# Patient Record
Sex: Male | Born: 1960 | Race: White | Hispanic: No | Marital: Single
Health system: Southern US, Community
[De-identification: ages and names within clinical notes are randomized; demographics above are authoritative.]

---

## 2009-08-29 ENCOUNTER — Inpatient Hospital Stay: Payer: Self-pay | Admitting: Internal Medicine

## 2011-04-19 IMAGING — US US PELVIS LIMITED
1 series · 17 of 25 positions shown · non-contrast
Comparison: none

REASON FOR EXAM: swollen testicle
COMMENTS:   LMP: (Male)

[Series 1: us pelvis limited · 17 of 48 slices shown]
[im 1/48]
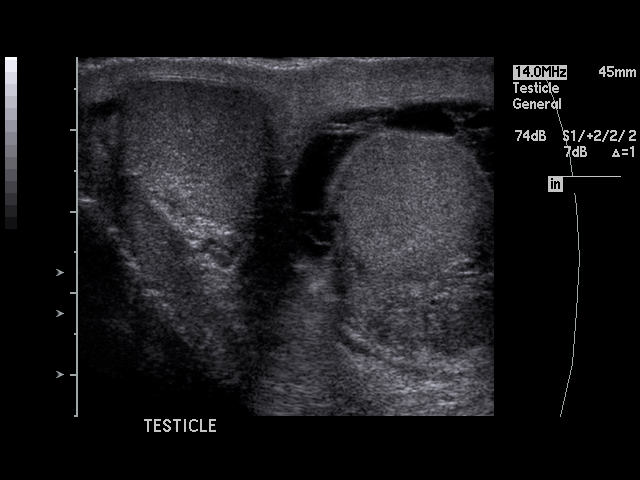
[im 4/48]
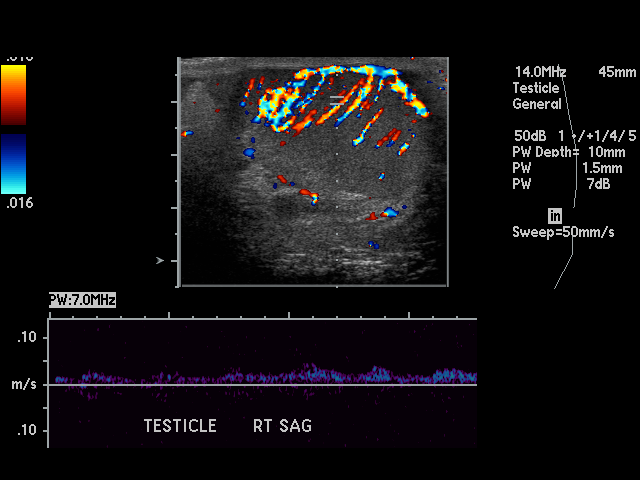
[im 6/48]
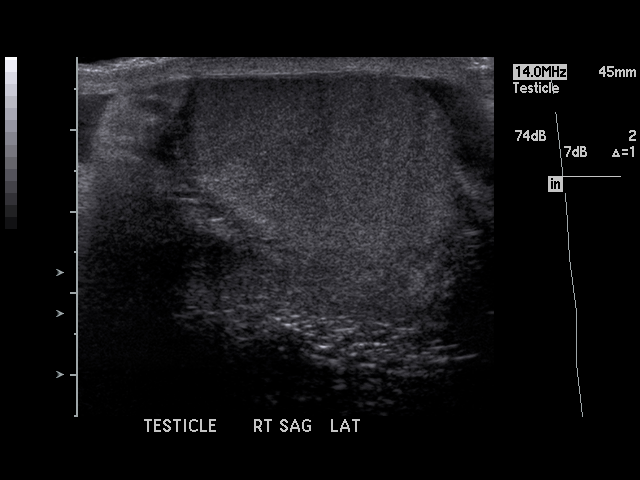
[im 10/48]
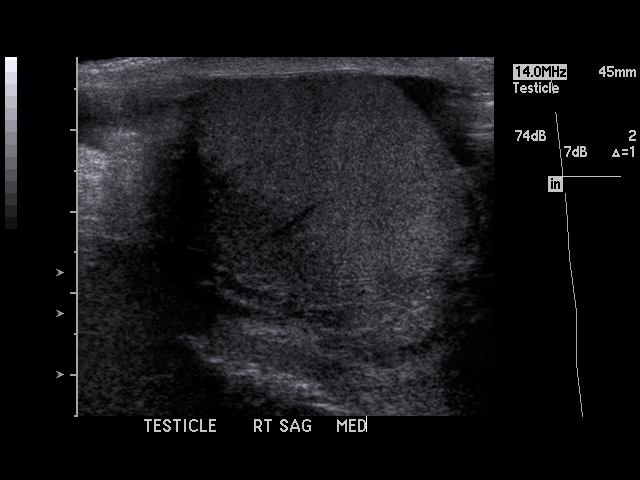
[im 12/48]
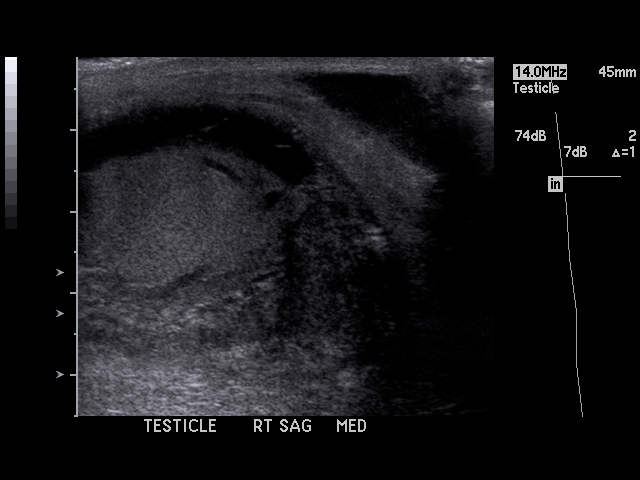
[im 16/48]
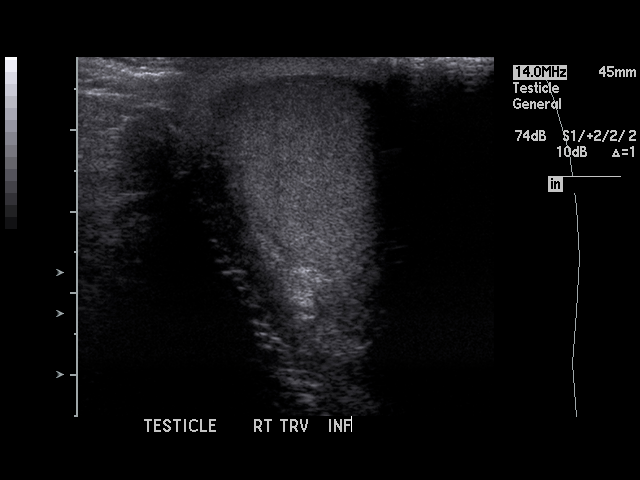
[im 18/48]
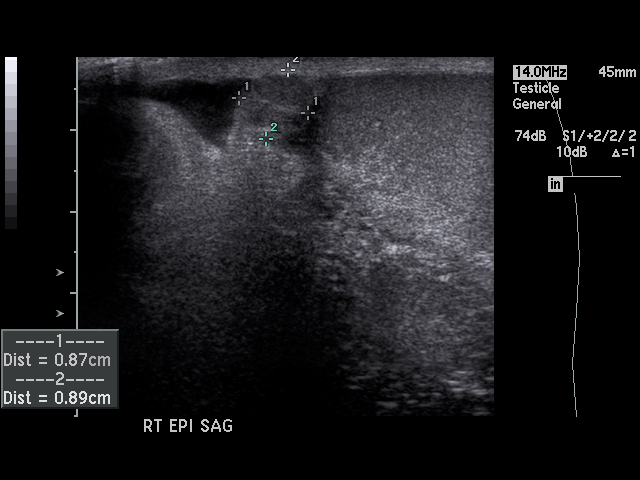
[im 22/48]
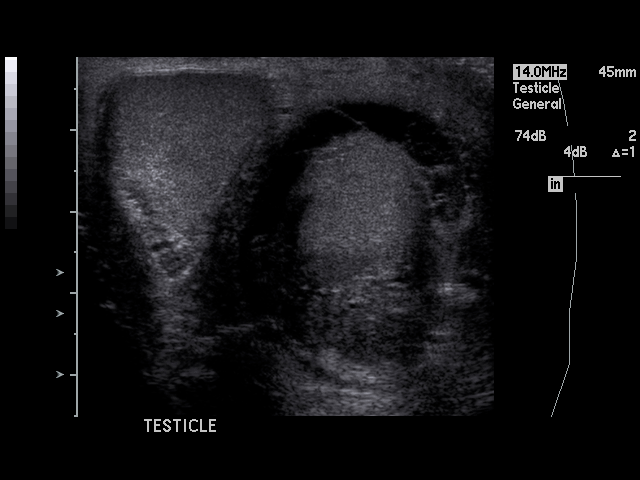
[im 24/48]
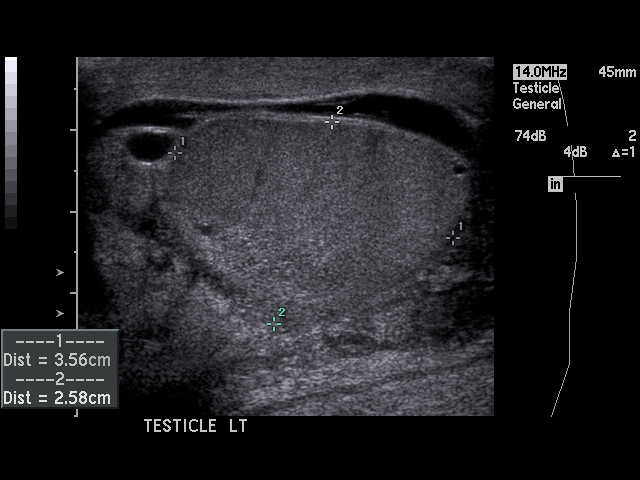
[im 26/48]
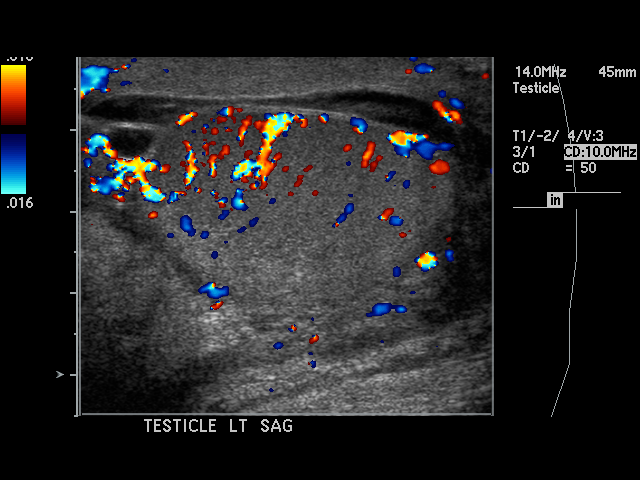
[im 30/48]
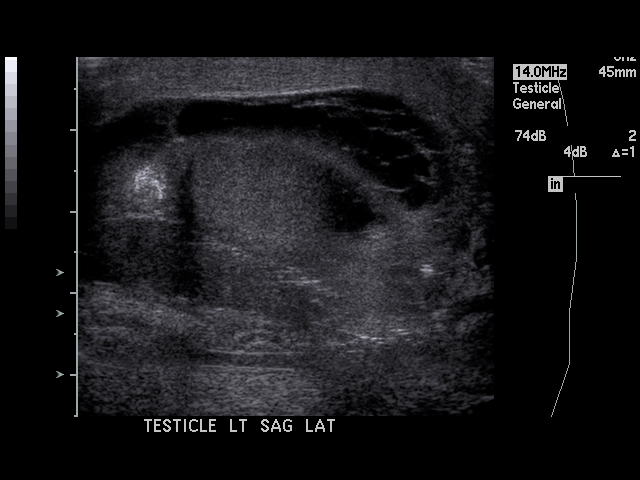
[im 32/48]
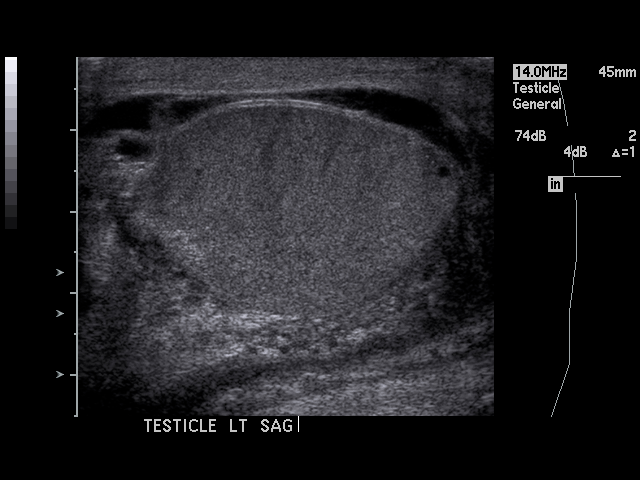
[im 36/48]
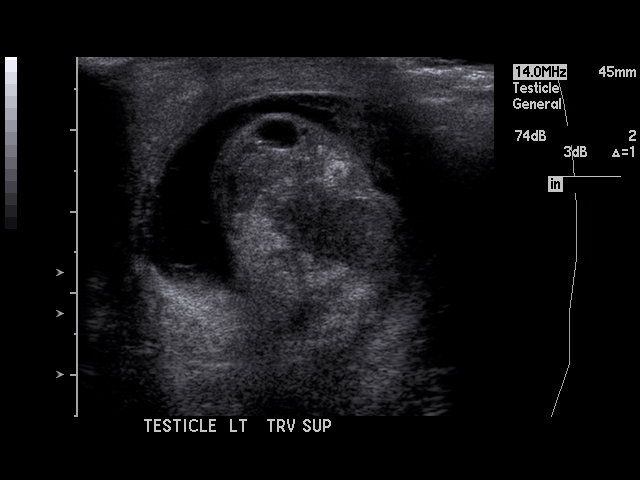
[im 38/48]
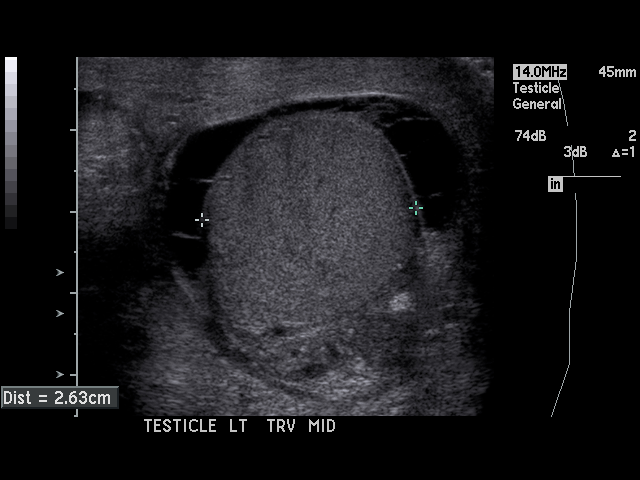
[im 42/48]
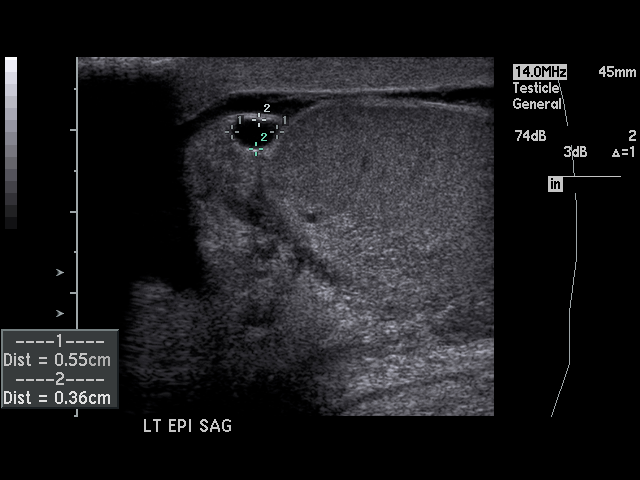
[im 44/48]
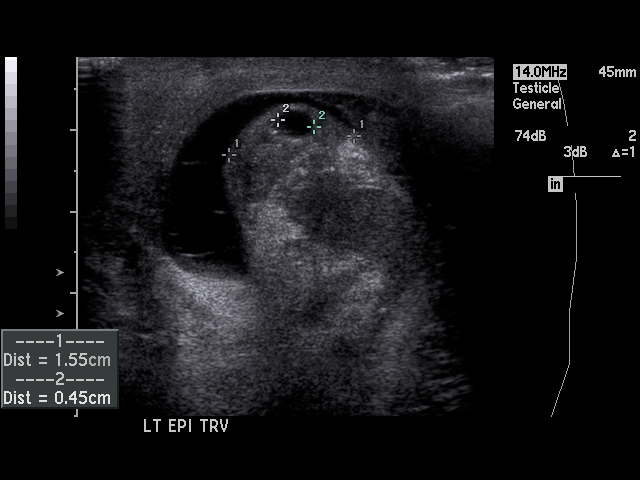
[im 48/48]
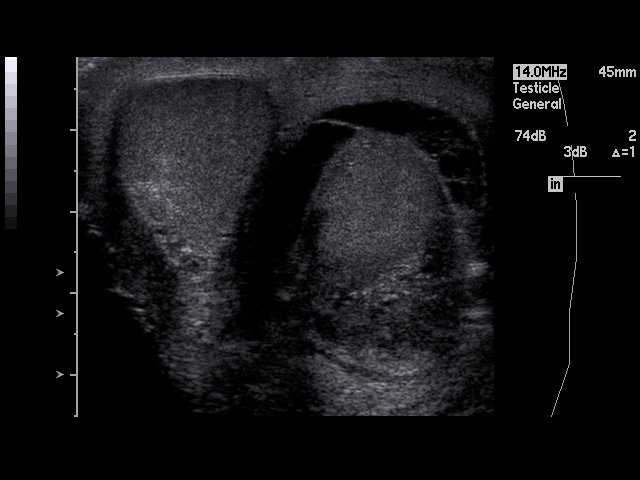

[17 of 25 positions shown; findings below may reference images not displayed]

PROCEDURE:     US  - US TESTICULAR  - August 28, 2009  [DATE]

RESULT:     The right and left testicles the testes exhibit normal
echotexture. Their vascularity is normal bilaterally. The right testicle
measures 3.9 x 2.3 x 2.6 cm. The left testicle measures 3.6 x 2.6 x 2.6 cm.

There is a hydrocele on the left containing septations. The epididymal
structures appear normal with there being an epididymal cyst on the left
measuring approximately 5 mm in diameter.
IMPRESSION: 1. There is no evidence of testicular torsion.
2. There is a hydrocele on the left containing septations.
3. There is an epididymal cyst on the left. Vascularity of the epididymal
structures is normal.

## 2011-04-22 IMAGING — CR DG CHEST 1V PORT
1 series · 1 of 1 positions shown · non-contrast
Comparison: none

REASON FOR EXAM: sepsis
COMMENTS:

PROCEDURE:     DXR - DXR PORTABLE CHEST SINGLE VIEW  - August 31, 2009  [DATE]
RESULT:     The patient has taken a poor inspiratory effort. No acute
cardiopulmonary disease noted.

[view not recorded]
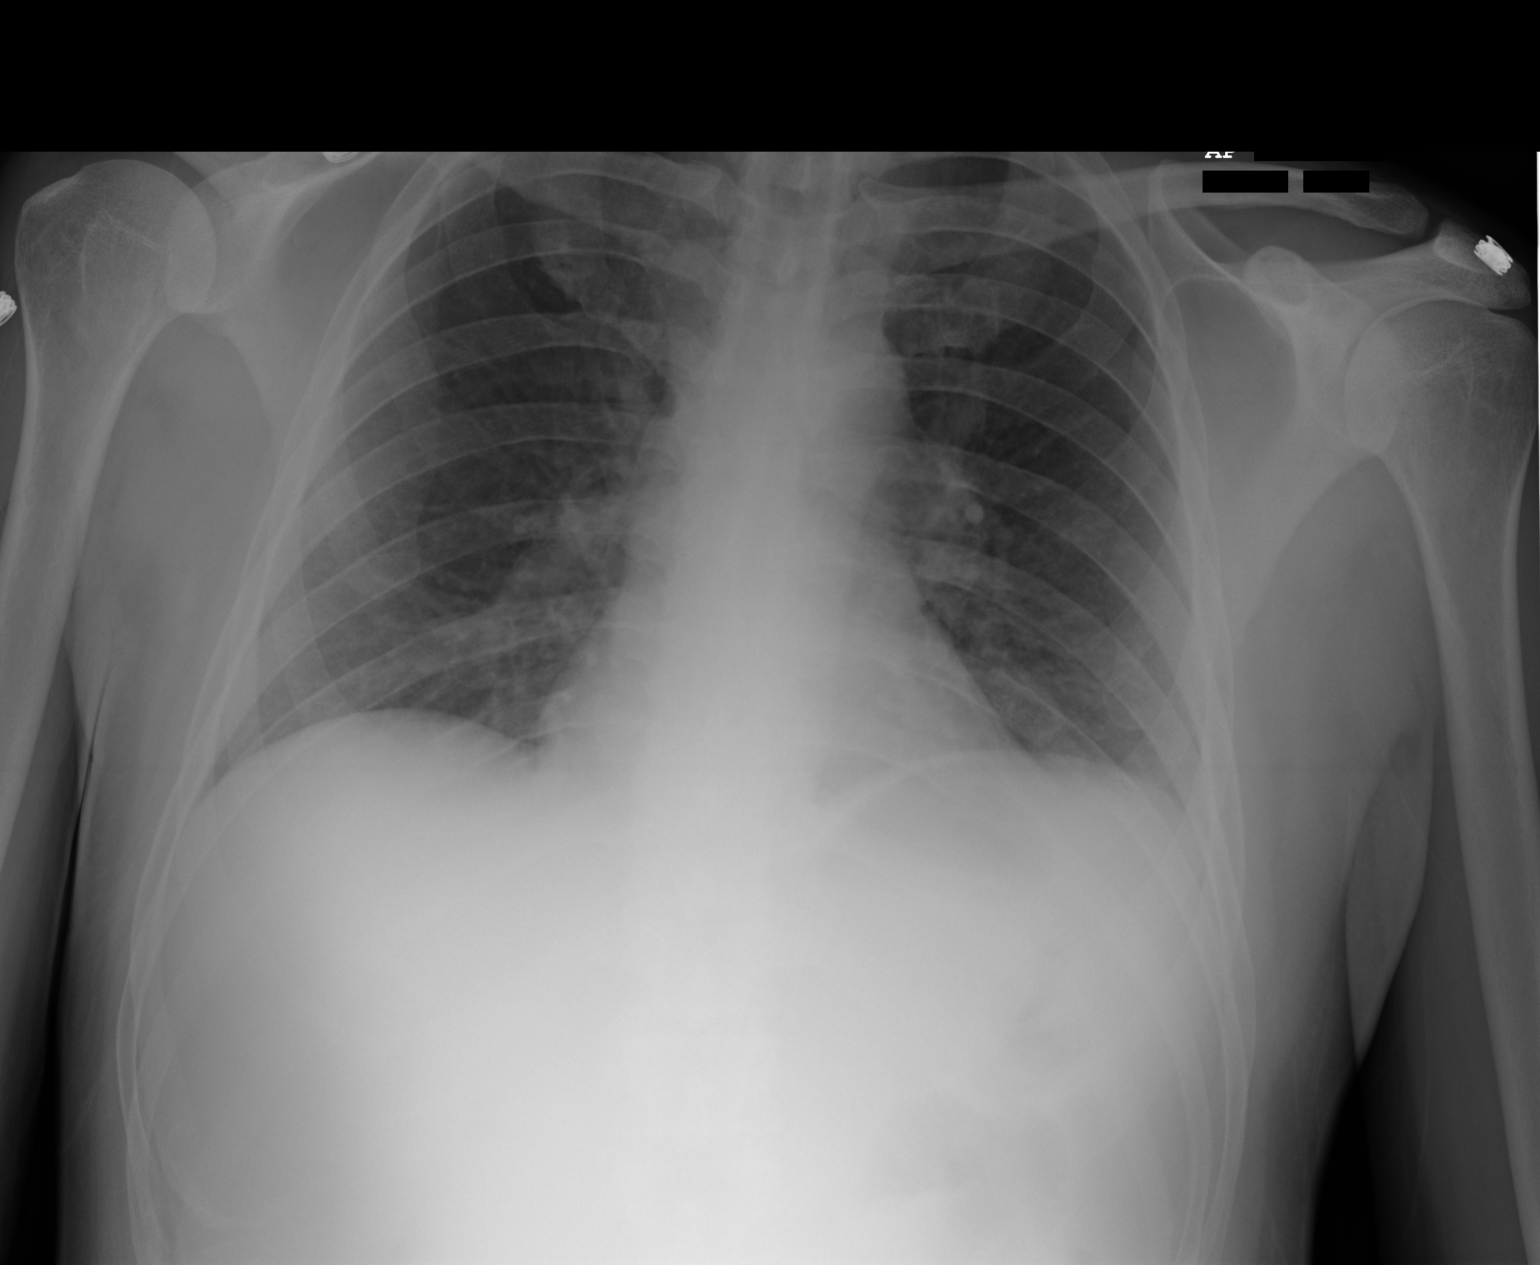

[1 of 1 positions shown; findings below may reference images not displayed]

IMPRESSION: 1. Mild bibasilar atelectasis most likely from poor inspiratory effort. No
acute abnormality identified. Chest is stable from prior exam.

## 2013-08-25 DIAGNOSIS — J45909 Unspecified asthma, uncomplicated: Secondary | ICD-10-CM | POA: Insufficient documentation

## 2013-08-25 DIAGNOSIS — F09 Unspecified mental disorder due to known physiological condition: Secondary | ICD-10-CM | POA: Insufficient documentation

## 2013-08-25 DIAGNOSIS — R251 Tremor, unspecified: Secondary | ICD-10-CM | POA: Insufficient documentation

## 2013-08-25 DIAGNOSIS — M24532 Contracture, left wrist: Secondary | ICD-10-CM | POA: Insufficient documentation

## 2013-08-25 DIAGNOSIS — E785 Hyperlipidemia, unspecified: Secondary | ICD-10-CM | POA: Insufficient documentation

## 2014-05-10 ENCOUNTER — Emergency Department: Payer: Self-pay | Admitting: Emergency Medicine

## 2015-05-30 DIAGNOSIS — K219 Gastro-esophageal reflux disease without esophagitis: Secondary | ICD-10-CM | POA: Insufficient documentation

## 2016-09-14 DIAGNOSIS — Z0000 Encounter for general adult medical examination without abnormal findings: Secondary | ICD-10-CM | POA: Insufficient documentation

## 2016-09-14 DIAGNOSIS — R7303 Prediabetes: Secondary | ICD-10-CM | POA: Insufficient documentation

## 2016-11-24 DIAGNOSIS — I10 Essential (primary) hypertension: Secondary | ICD-10-CM | POA: Insufficient documentation

## 2019-09-26 DIAGNOSIS — Z789 Other specified health status: Secondary | ICD-10-CM | POA: Insufficient documentation

## 2021-05-21 ENCOUNTER — Other Ambulatory Visit: Payer: Self-pay

## 2021-05-21 ENCOUNTER — Ambulatory Visit (INDEPENDENT_AMBULATORY_CARE_PROVIDER_SITE_OTHER): Payer: Medicare Other | Admitting: Podiatry

## 2021-05-21 DIAGNOSIS — L03031 Cellulitis of right toe: Secondary | ICD-10-CM | POA: Diagnosis not present

## 2021-05-21 DIAGNOSIS — L600 Ingrowing nail: Secondary | ICD-10-CM | POA: Diagnosis not present

## 2021-05-21 MED ORDER — DOXYCYCLINE HYCLATE 100 MG PO TABS: 100.0000 mg | ORAL_TABLET | Freq: Two times a day (BID) | ORAL | 0 refills | Status: AC

## 2021-05-21 NOTE — Progress Notes (Signed)
?Subjective:  ?Patient ID: Bradley Cervantes, male    DOB: Sep 09, 1960,  MRN: [id number] ? ?Chief Complaint  ?Patient presents with  ? Nail Problem  ?  Right hallux nail   ? ? ?61 y.o. male presents with the above complaint.  Patient presents with right hallux paronychia.  Patient is is here with his caretaker from nursing facility.  He is unable to compensate however they state that his skin became red out of nowhere. ? ?Versus causing him some discomfort.  There is complaint of bleeding associated with it as well.  He began keeping it nice and dry and keeping it covered.  He has started taking antibiotics from his primary care doctor however was able to review it.  He denies any other acute complaints. ? ? ?Review of Systems: Negative except as noted in the HPI. Denies N/V/F/Ch. ? ?No past medical history on file. ? ?Current Outpatient Medications:  ?  doxycycline (VIBRA-TABS) 100 MG tablet, Take 1 tablet (100 mg total) by mouth 2 (two) times daily for 14 days., Disp: 28 tablet, Rfl: 0 ?  amLODipine (NORVASC) 5 MG tablet, Take 5 mg by mouth daily., Disp: , Rfl:  ?  baclofen (LIORESAL) 10 MG tablet, Take 10 mg by mouth 3 (three) times daily., Disp: , Rfl:  ?  diclofenac Sodium (VOLTAREN) 1 % GEL, Apply topically., Disp: , Rfl:  ?  gabapentin (NEURONTIN) 300 MG capsule, Take by mouth., Disp: , Rfl:  ?  gabapentin (NEURONTIN) 400 MG capsule, Take 400 mg by mouth 3 (three) times daily., Disp: , Rfl:  ?  ketoconazole (NIZORAL) 2 % shampoo, Apply topically., Disp: , Rfl:  ?  levocetirizine (XYZAL) 5 MG tablet, Take 5 mg by mouth daily., Disp: , Rfl:  ?  meloxicam (MOBIC) 7.5 MG tablet, Take 7.5 mg by mouth daily., Disp: , Rfl:  ?  montelukast (SINGULAIR) 10 MG tablet, Take 10 mg by mouth daily., Disp: , Rfl:  ?  pantoprazole (PROTONIX) 40 MG tablet, Take 40 mg by mouth daily., Disp: , Rfl:  ?  propranolol (INDERAL) 40 MG tablet, Take 40 mg by mouth 2 (two) times daily., Disp: , Rfl:  ?  selenium sulfide (SELSUN) 2.5 % shampoo,  Apply topically., Disp: , Rfl:  ? ?Social History  ? ?Tobacco Use  ?Smoking Status Not on file  ?Smokeless Tobacco Not on file  ? ? ?Not on File ?Objective:  ?There were no vitals filed for this visit. ?There is no height or weight on file to calculate BMI. ?Constitutional Well developed. ?Well nourished.  ?Vascular Dorsalis pedis pulses palpable bilaterally. ?Posterior tibial pulses palpable bilaterally. ?Capillary refill normal to all digits.  ?No cyanosis or clubbing noted. ?Pedal hair growth normal.  ?Neurologic Normal speech. ?Oriented to person, place, and time. ?Epicritic sensation to light touch grossly present bilaterally.  ?Dermatologic Paronychia noted to medial lateral proximal nail fold of right hallux.  Mild pain on palpation.  Ingrown nail is noted to both medial lateral border  ?Orthopedic: Normal joint ROM without pain or crepitus bilaterally. ?No visible deformities. ?No bony tenderness.  ? ?Radiographs: None ?Assessment:  ? ?1. Paronychia of toe of right foot due to ingrown toenail   ? ?Plan:  ?Patient was evaluated and treated and all questions answered. ? ?Right hallux paronychia due to ingrown nail ?-I explained to the patient etiology of ingrown advised treatment options were discussed. ?-I believe patient would benefit from antibiotics to help clear out the infection.  Eventually there are some signs of  trauma to the nail and the nail esophagus itself.  I discussed with the patient and the caretaker that if it does not, please Oka back and see me sooner.  I will see him back in 3 weeks to evaluate the redness and make sure it is improving.  Patient was emergency room with the redness becomes more symptomatic. ? ?No follow-ups on file. ?

## 2021-06-11 ENCOUNTER — Ambulatory Visit (INDEPENDENT_AMBULATORY_CARE_PROVIDER_SITE_OTHER): Payer: Medicare Other | Admitting: Podiatry

## 2021-06-11 ENCOUNTER — Other Ambulatory Visit: Payer: Self-pay

## 2021-06-11 DIAGNOSIS — L03031 Cellulitis of right toe: Secondary | ICD-10-CM | POA: Diagnosis not present

## 2021-06-11 DIAGNOSIS — L600 Ingrowing nail: Secondary | ICD-10-CM

## 2021-06-11 NOTE — Progress Notes (Signed)
?  Subjective:  ?Patient ID: Bradley Cervantes, male    DOB: 06/14/1960,  MRN: [id number] ? ?Chief Complaint  ?Patient presents with  ? Nail Problem  ?  Nail check   ? ? ?61 y.o. male presents with the above complaint.  Patient presents with follow-up of right hallux paronychia.  He is states that he is doing a lot better.  He is here with his caretaker.  They both agree with the plan the redness is resolved he is complete his antibiotic course. ? ?Review of Systems: Negative except as noted in the HPI. Denies N/V/F/Ch. ? ?No past medical history on file. ? ?Current Outpatient Medications:  ?  amLODipine (NORVASC) 5 MG tablet, Take 5 mg by mouth daily., Disp: , Rfl:  ?  baclofen (LIORESAL) 10 MG tablet, Take 10 mg by mouth 3 (three) times daily., Disp: , Rfl:  ?  diclofenac Sodium (VOLTAREN) 1 % GEL, Apply topically., Disp: , Rfl:  ?  gabapentin (NEURONTIN) 300 MG capsule, Take by mouth., Disp: , Rfl:  ?  gabapentin (NEURONTIN) 400 MG capsule, Take 400 mg by mouth 3 (three) times daily., Disp: , Rfl:  ?  ketoconazole (NIZORAL) 2 % shampoo, Apply topically., Disp: , Rfl:  ?  levocetirizine (XYZAL) 5 MG tablet, Take 5 mg by mouth daily., Disp: , Rfl:  ?  meloxicam (MOBIC) 7.5 MG tablet, Take 7.5 mg by mouth daily., Disp: , Rfl:  ?  montelukast (SINGULAIR) 10 MG tablet, Take 10 mg by mouth daily., Disp: , Rfl:  ?  pantoprazole (PROTONIX) 40 MG tablet, Take 40 mg by mouth daily., Disp: , Rfl:  ?  propranolol (INDERAL) 40 MG tablet, Take 40 mg by mouth 2 (two) times daily., Disp: , Rfl:  ?  selenium sulfide (SELSUN) 2.5 % shampoo, Apply topically., Disp: , Rfl:  ? ?Social History  ? ?Tobacco Use  ?Smoking Status Not on file  ?Smokeless Tobacco Not on file  ? ? ?Not on File ?Objective:  ?There were no vitals filed for this visit. ?There is no height or weight on file to calculate BMI. ?Constitutional Well developed. ?Well nourished.  ?Vascular Dorsalis pedis pulses palpable bilaterally. ?Posterior tibial pulses palpable  bilaterally. ?Capillary refill normal to all digits.  ?No cyanosis or clubbing noted. ?Pedal hair growth normal.  ?Neurologic Normal speech. ?Oriented to person, place, and time. ?Epicritic sensation to light touch grossly present bilaterally.  ?Dermatologic No further paronychia noted to medial lateral proximal nail fold of right hallux.  Mild pain on palpation.  Ingrown nail is noted to both medial lateral border  ?Orthopedic: Normal joint ROM without pain or crepitus bilaterally. ?No visible deformities. ?No bony tenderness.  ? ?Radiographs: None ?Assessment:  ? ?1. Paronychia of toe of right foot due to ingrown toenail   ? ? ?Plan:  ?Patient was evaluated and treated and all questions answered. ? ?Right hallux paronychia due to ingrown nail~resolved ?-I explained to the patient etiology of ingrown advised treatment options were discussed. ?-Clinically resolved.  Discussed with the patient and his caretaker that shoe gear modification do not put a lot of pressure on the toe would help.  I discussed with him that eventually the nail may fall off as well.  Patient and the caretaker state understanding. ?No follow-ups on file. ?

## 2023-11-10 ENCOUNTER — Ambulatory Visit (INDEPENDENT_AMBULATORY_CARE_PROVIDER_SITE_OTHER): Admitting: Podiatry

## 2023-11-10 DIAGNOSIS — M79674 Pain in right toe(s): Secondary | ICD-10-CM | POA: Diagnosis not present

## 2023-11-10 DIAGNOSIS — M79675 Pain in left toe(s): Secondary | ICD-10-CM | POA: Diagnosis not present

## 2023-11-10 DIAGNOSIS — B351 Tinea unguium: Secondary | ICD-10-CM | POA: Diagnosis not present

## 2023-11-10 NOTE — Progress Notes (Signed)
  Subjective:  Patient ID: Bradley Cervantes, male    DOB: July 02, 1960,  MRN: [id number]  Chief Complaint  Patient presents with   Nail Problem    Pt is here due to right great toenail, has came off, caregiver states he has been clean the toe with soapy water and putting antibiotic cream and band aid, also wants his toenail trim.   63 y.o. male returns for the above complaint.  Patient presents with thickened and longer dystrophic mycotic toenails x 10 mild pain on palpation hurts with ambulation is with pressure patient would like to have debridement unable to do it himself  Objective:  There were no vitals filed for this visit. Podiatric Exam: Vascular: dorsalis pedis and posterior tibial pulses are palpable bilateral. Capillary return is immediate. Temperature gradient is WNL. Skin turgor WNL  Sensorium: Normal Semmes Weinstein monofilament test. Normal tactile sensation bilaterally. Nail Exam: Pt has thick disfigured discolored nails with subungual debris noted bilateral entire nail hallux through fifth toenails.  Pain on palpation to the nails. Ulcer Exam: There is no evidence of ulcer or pre-ulcerative changes or infection. Orthopedic Exam: Muscle tone and strength are WNL. No limitations in general ROM. No crepitus or effusions noted.  Skin: No Porokeratosis. No infection or ulcers    Assessment & Plan:   1. Pain due to onychomycosis of toenails of both feet     Patient was evaluated and treated and all questions answered.  Onychomycosis with pain  -Nails palliatively debrided as below. -Educated on self-care  Procedure: Nail Debridement Rationale: pain  Type of Debridement: manual, sharp debridement. Instrumentation: Nail nipper, rotary burr. Number of Nails: 9  Procedures and Treatment: Consent by patient was obtained for treatment procedures. The patient understood the discussion of treatment and procedures well. All questions were answered thoroughly reviewed. Debridement of  mycotic and hypertrophic toenails, 1 through 5 bilateral and clearing of subungual debris. No ulceration, no infection noted.  Return Visit-Office Procedure: Patient instructed to return to the office for a follow up visit 3 months for continued evaluation and treatment.  Franky Blanch, DPM    Return in about 3 months (around 02/10/2024) for [facility] with Dr Gaynel.

## 2024-02-10 ENCOUNTER — Ambulatory Visit (INDEPENDENT_AMBULATORY_CARE_PROVIDER_SITE_OTHER): Admitting: Podiatry

## 2024-02-10 ENCOUNTER — Encounter: Payer: Self-pay | Admitting: Podiatry

## 2024-02-10 DIAGNOSIS — M79675 Pain in left toe(s): Secondary | ICD-10-CM

## 2024-02-10 DIAGNOSIS — F840 Autistic disorder: Secondary | ICD-10-CM | POA: Insufficient documentation

## 2024-02-10 DIAGNOSIS — B351 Tinea unguium: Secondary | ICD-10-CM | POA: Diagnosis not present

## 2024-02-10 DIAGNOSIS — M79674 Pain in right toe(s): Secondary | ICD-10-CM | POA: Diagnosis not present

## 2024-02-10 NOTE — Progress Notes (Signed)
  Subjective:  Patient ID: Bradley Cervantes, male    DOB: 08-10-1960,  MRN: [id number]  Bradley Cervantes presents to clinic today for thick, elongated toenails of both feet which are tender when wearing enclosed shoe gear. Patient accompanied by caregiver, Miss Alcora, on today's visit. Chief Complaint  Patient presents with   Toe Pain    RFC. She saw Dr. Lenon in July. A1c 5.5   New problem(s): None.   PCP is Lenon Layman ORN, MD.  Allergies  Allergen Reactions   Haloperidol Other (See Comments)    Review of Systems: Negative except as noted in the HPI.  Objective: No changes noted in today's physical examination. There were no vitals filed for this visit. Bradley Cervantes is a pleasant 63 y.o. male WD, WN in NAD. AAO x 3.  Vascular Examination: Capillary refill time immediate b/l. Palpable pedal pulses. Pedal hair present b/l. Pedal edema absent b/l. No pain with calf compression b/l. Skin temperature gradient WNL b/l. No cyanosis or clubbing b/l. No ischemia or gangrene noted b/l.   Neurological Examination:  Patient unable to follow commands of LE neurological examination due to cognitive deficits. Patient does respond to external noxious stimuli.  Dermatological Examination: Pedal skin with normal turgor, texture and tone b/l.  No open wounds. No interdigital macerations.   Toenails 1-5 b/l thick, discolored, elongated with subungual debris and pain on dorsal palpation.   No hyperkeratotic nor porokeratotic lesions.  Musculoskeletal Examination: Muscle strength 5/5 to all lower extremity muscle groups bilaterally. No pain, crepitus or joint limitation noted with ROM bilateral LE. No gross bony deformities bilaterally.  Radiographs: None  Assessment/Plan: 1. Pain due to onychomycosis of toenails of both feet     -Caregiver/provider present with patient on today's visit. -Consent given for treatment as described below: -Examined patient. -Patient to continue soft,  supportive shoe gear daily. -Toenails 1-5 b/l were debrided in length and girth with sterile nail nippers without iatrogenic bleeding.  -Patient/POA to call should there be question/concern in the interim.   Return in about 3 months (around 05/12/2024).  Bradley Cervantes, DPM      [facility] LOCATION: [address], [place] [address]                   Office [phone]   [facility] LOCATION: [address] [place], [place] [address] Office [phone]
